# Patient Record
Sex: Male | Born: 2000 | Race: White | Hispanic: Yes | Marital: Single | Smoking: Never smoker
Health system: Southern US, Community
[De-identification: ages and names within clinical notes are randomized; demographics above are authoritative.]

## PROBLEM LIST (undated history)

## (undated) DIAGNOSIS — H539 Unspecified visual disturbance: Secondary | ICD-10-CM

## (undated) DIAGNOSIS — L858 Other specified epidermal thickening: Secondary | ICD-10-CM

## (undated) HISTORY — DX: Unspecified visual disturbance: H53.9

## (undated) HISTORY — DX: Other specified epidermal thickening: L85.8

---
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
## 2021-02-10 ENCOUNTER — Ambulatory Visit (INDEPENDENT_AMBULATORY_CARE_PROVIDER_SITE_OTHER): Payer: Medicaid Other

## 2021-02-10 DIAGNOSIS — Z23 Encounter for immunization: Secondary | ICD-10-CM | POA: Diagnosis not present

## 2021-02-10 NOTE — Progress Notes (Signed)
° °  Covid-19 Vaccination Clinic  Name:  Brian Randolph    MRN: [id number] DOB: 2000-03-27  02/10/2021  Mr. Brian Randolph was observed post Covid-19 immunization for 15 minutes without incident. He was provided with Vaccine Information Sheet and instruction to access the V-Safe system.   Mr. Brian Randolph was instructed to call 911 with any severe reactions post vaccine: Difficulty breathing  Swelling of face and throat  A fast heartbeat  A bad rash all over body  Dizziness and weakness   Immunizations Administered     Name Date Dose VIS Date Route   Pfizer Covid-19 Vaccine Bivalent Booster 02/10/2021  9:39 AM 0.3 mL 10/04/2020 Intramuscular   Manufacturer: [employer], [employer]   Lot: [id number]   NDC: [phone]

## 2021-03-19 ENCOUNTER — Ambulatory Visit (HOSPITAL_COMMUNITY)
Admission: EM | Admit: 2021-03-19 | Discharge: 2021-03-19 | Disposition: A | Discharge status: Home / Self Care | Payer: Medicaid Other | Attending: Internal Medicine | Admitting: Internal Medicine

## 2021-03-19 ENCOUNTER — Encounter (HOSPITAL_COMMUNITY): Payer: Self-pay

## 2021-03-19 DIAGNOSIS — R52 Pain, unspecified: Secondary | ICD-10-CM | POA: Diagnosis not present

## 2021-03-19 DIAGNOSIS — W57XXXA Bitten or stung by nonvenomous insect and other nonvenomous arthropods, initial encounter: Secondary | ICD-10-CM

## 2021-03-19 DIAGNOSIS — R509 Fever, unspecified: Secondary | ICD-10-CM

## 2021-03-19 MED ORDER — DOXYCYCLINE HYCLATE 100 MG PO CAPS: 100.0000 mg | ORAL_CAPSULE | Freq: Two times a day (BID) | ORAL | 0 refills | Status: DC

## 2021-03-19 MED ORDER — ACETAMINOPHEN 325 MG PO TABS
ORAL_TABLET | ORAL | Status: AC
  Filled 2021-03-19: qty 2

## 2021-03-19 MED ORDER — ACETAMINOPHEN 325 MG PO TABS
650.0000 mg | ORAL_TABLET | Freq: Once | ORAL | Status: AC
  Administered 2021-03-19: 650 mg via ORAL

## 2021-03-19 NOTE — Discharge Instructions (Signed)
The cause of your symptoms is truly unknown, typically with bedbug bite it should not cause a full-body infection have a low risk of causing a skin infection at all however since her symptoms have been present for 3 weeks we will attempt use of antibiotic to help  Take doxycycline twice a day for the next 7 days  Continue use of over-the-counter Tylenol or ibuprofen to manage fevers and body aches  Please follow-up with urgent care as needed if symptoms continue to persist

## 2021-03-19 NOTE — ED Triage Notes (Addendum)
Pt presents with intermittent generalized body aches X 3 weeks with no known injury.

## 2021-03-19 NOTE — ED Provider Notes (Signed)
[facility]    CSN: [id number] Arrival date & time: 03/19/21  1914      History   Chief Complaint Chief Complaint  Patient presents with   Generalized Body Aches    HPI Brian Randolph is a 21 y.o. male.   Patient presents with fever and body aches occurring intermittently for 3 weeks.  Symptoms began after bedbug bites to the left forearm.  Has attempted use of Tylenol which has been helpful in managing fever.  Denies nasal congestion, rhinorrhea, sore throat, ear pain, cough, shortness of breath, wheezing, abdominal pain, nausea, vomiting, diarrhea, headaches, urinary or bowel changes.    Past Medical History:  Diagnosis Date   Chronic cough due to GERD 05/08/2020   Keratosis pilaris    Vision abnormalities     Patient Active Problem List   Diagnosis Date Noted   Gastroesophageal reflux disease 07/04/2020   It band syndrome, right 06/03/2018   Sickle cell trait (HCC) 09/21/2014   Keratosis pilaris 08/25/2012   Myopia of both eyes 08/25/2012    History reviewed. No pertinent surgical history.     Home Medications    Prior to Admission medications   Medication Sig Start Date End Date Taking? Authorizing Provider  omeprazole (PRILOSEC OTC) 20 MG tablet Take 1 tablet (20 mg total) by mouth daily. Take 30 minutes before eating. Patient not taking: Reported on 07/04/2020 05/05/20   Ettefagh, Aron Baba, MD  omeprazole (PRILOSEC) 10 MG capsule Take 1 capsule (10 mg total) by mouth daily. 07/04/20   Ettefagh, Aron Baba, MD    Family History History reviewed. No pertinent family history.  Social History Social History   Tobacco Use   Smoking status: Never   Smokeless tobacco: Never     Allergies   Patient has no known allergies.   Review of Systems Review of Systems  Constitutional:  Positive for fever. Negative for activity change, appetite change, chills, diaphoresis, fatigue and unexpected weight change.  HENT: Negative.    Respiratory:  Negative.    Cardiovascular: Negative.   Gastrointestinal: Negative.   Musculoskeletal:  Positive for myalgias. Negative for arthralgias, back pain, gait problem, joint swelling, neck pain and neck stiffness.  Skin: Negative.   Neurological: Negative.     Physical Exam Triage Vital Signs ED Triage Vitals  Enc Vitals Group     BP 03/19/21 2030 118/76     Pulse Rate 03/19/21 2026 79     Resp 03/19/21 2026 18     Temp 03/19/21 2026 (!) 101.8 F (38.8 C)     Temp Source 03/19/21 2026 Oral     SpO2 03/19/21 2026 99 %     Weight --      Height --      Head Circumference --      Peak Flow --      Pain Score 03/19/21 2025 5     Pain Loc --      Pain Edu? --      Excl. in GC? --    No data found.  Updated Vital Signs BP 118/76    Pulse 79    Temp (!) 101.8 F (38.8 C) (Oral)    Resp 18    SpO2 99%   Visual Acuity Right Eye Distance:   Left Eye Distance:   Bilateral Distance:    Right Eye Near:   Left Eye Near:    Bilateral Near:     Physical Exam Constitutional:      Appearance:  Normal appearance.  HENT:     Head: Normocephalic.  Eyes:     Extraocular Movements: Extraocular movements intact.  Pulmonary:     Effort: Pulmonary effort is normal.  Skin:    Comments: Scant mildly erythematous macules present on the left forearm  Neurological:     Mental Status: He is alert and oriented to person, place, and time. Mental status is at baseline.  Psychiatric:        Mood and Affect: Mood normal.        Behavior: Behavior normal.     UC Treatments / Results  Labs (all labs ordered are listed, but only abnormal results are displayed) Labs Reviewed - No data to display  EKG   Radiology No results found.  Procedures Procedures (including critical care time)  Medications Ordered in UC Medications  acetaminophen (TYLENOL) tablet 650 mg (650 mg Oral Given 03/19/21 2030)    Initial Impression / Assessment and Plan / UC Course  I have reviewed the triage vital  signs and the nursing notes.  Pertinent labs & imaging results that were available during my care of the patient were reviewed by me and considered in my medical decision making (see chart for details).  Fever Body aches Bug bite, initial encounter  Etiology of symptoms unknown at this time, patient endorses bug bite but skin is inconclusive due to timeline of illness, also bedbug bites should not initiate systemic reaction, discussed with patient however due to timeline of illness will move forward with coverage for bacteria, doxycycline 7-day course prescribed, Tylenol given in office for fever of 101.8 in triage, patient encouraged to continue use of Tylenol in the outpatient setting, may follow-up with urgent care as needed Final Clinical Impressions(s) / UC Diagnoses   Final diagnoses:  None   Discharge Instructions   None    ED Prescriptions   None    PDMP not reviewed this encounter.   Valinda Hoar, NP 03/20/21 1002

## 2021-04-13 ENCOUNTER — Encounter (HOSPITAL_COMMUNITY): Payer: Self-pay

## 2021-04-13 ENCOUNTER — Ambulatory Visit (HOSPITAL_COMMUNITY)
Admission: EM | Admit: 2021-04-13 | Discharge: 2021-04-13 | Disposition: A | Discharge status: Home / Self Care | Payer: Medicaid Other | Attending: Nurse Practitioner | Admitting: Nurse Practitioner

## 2021-04-13 ENCOUNTER — Other Ambulatory Visit: Payer: Self-pay

## 2021-04-13 DIAGNOSIS — J111 Influenza due to unidentified influenza virus with other respiratory manifestations: Secondary | ICD-10-CM

## 2021-04-13 DIAGNOSIS — J301 Allergic rhinitis due to pollen: Secondary | ICD-10-CM | POA: Insufficient documentation

## 2021-04-13 DIAGNOSIS — R509 Fever, unspecified: Secondary | ICD-10-CM | POA: Insufficient documentation

## 2021-04-13 DIAGNOSIS — R52 Pain, unspecified: Secondary | ICD-10-CM | POA: Insufficient documentation

## 2021-04-13 DIAGNOSIS — R067 Sneezing: Secondary | ICD-10-CM | POA: Insufficient documentation

## 2021-04-13 DIAGNOSIS — Z20822 Contact with and (suspected) exposure to covid-19: Secondary | ICD-10-CM | POA: Insufficient documentation

## 2021-04-13 LAB — CBC WITH DIFFERENTIAL/PLATELET
Abs Immature Granulocytes: 0.04 10*3/uL (ref 0.00–0.07)
Basophils Absolute: 0 10*3/uL (ref 0.0–0.1)
Basophils Relative: 1 %
Eosinophils Absolute: 0.1 10*3/uL (ref 0.0–0.5)
Eosinophils Relative: 2 %
HCT: 36.4 % — ABNORMAL LOW (ref 39.0–52.0)
Hemoglobin: 13.1 g/dL (ref 13.0–17.0)
Immature Granulocytes: 1 %
Lymphocytes Relative: 28 %
Lymphs Abs: 2.3 10*3/uL (ref 0.7–4.0)
MCH: 30.5 pg (ref 26.0–34.0)
MCHC: 36 g/dL (ref 30.0–36.0)
MCV: 84.8 fL (ref 80.0–100.0)
Monocytes Absolute: 1.1 10*3/uL — ABNORMAL HIGH (ref 0.1–1.0)
Monocytes Relative: 14 %
Neutro Abs: 4.5 10*3/uL (ref 1.7–7.7)
Neutrophils Relative %: 54 %
Platelets: 155 10*3/uL (ref 150–400)
RBC: 4.29 MIL/uL (ref 4.22–5.81)
RDW: 12.8 % (ref 11.5–15.5)
WBC: 8.1 10*3/uL (ref 4.0–10.5)
nRBC: 0 % (ref 0.0–0.2)

## 2021-04-13 LAB — POC INFLUENZA A AND B ANTIGEN (URGENT CARE ONLY)
INFLUENZA A ANTIGEN, POC: NEGATIVE
INFLUENZA B ANTIGEN, POC: NEGATIVE

## 2021-04-13 LAB — SARS CORONAVIRUS 2 (TAT 6-24 HRS): SARS Coronavirus 2: NEGATIVE

## 2021-04-13 NOTE — ED Provider Notes (Signed)
?[facility] ? ? ? ?CSN: [id number] ?Arrival date & time: 04/13/21  1048 ? ? ?  ? ?History   ?Chief Complaint ?Chief Complaint  ?Patient presents with  ? Fever  ? Generalized Body Aches  ? ? ?HPI ?Brian Randolph is a 21 y.o. male.  ? ?Patient reports 4-day history of tactile fever, sweats, body aches.  He denies cough, shortness of breath, wheezing, chest tightness, congestion, sore throat, ear pain, nausea, vomiting, diarrhea, and change in appetite.  He denies any new rash and any sick contacts.  He reports sneezing, eye redness/itching, abdominal pain, headache when he has a fever, and increased fatigue.  He has taken Tylenol with relief of the fever. ? ?He works outside as a Administrator. ? ? ? ? ?Past Medical History:  ?Diagnosis Date  ? Chronic cough due to GERD 05/08/2020  ? Keratosis pilaris   ? Vision abnormalities   ? ? ?Patient Active Problem List  ? Diagnosis Date Noted  ? Gastroesophageal reflux disease 07/04/2020  ? It band syndrome, right 06/03/2018  ? Sickle cell trait (HCC) 09/21/2014  ? Keratosis pilaris 08/25/2012  ? Myopia of both eyes 08/25/2012  ? ? ?History reviewed. No pertinent surgical history. ? ? ? ? ?Home Medications   ? ?Prior to Admission medications   ?Not on File  ? ? ?Family History ?History reviewed. No pertinent family history. ? ?Social History ?Social History  ? ?Tobacco Use  ? Smoking status: Never  ? Smokeless tobacco: Never  ?Substance Use Topics  ? Alcohol use: Never  ? Drug use: Never  ? ? ? ?Allergies   ?Patient has no known allergies. ? ? ?Review of Systems ?Review of Systems ?Per HPI ? ?Physical Exam ?Triage Vital Signs ?ED Triage Vitals  ?Enc Vitals Group  ?   BP 04/13/21 1121 127/70  ?   Pulse Rate 04/13/21 1121 78  ?   Resp 04/13/21 1121 18  ?   Temp 04/13/21 1121 98.7 ?F (37.1 ?C)  ?   Temp Source 04/13/21 1121 Oral  ?   SpO2 04/13/21 1121 98 %  ?   Weight --   ?   Height --   ?   Head Circumference --   ?   Peak Flow --   ?   Pain Score 04/13/21 1122 0  ?    Pain Loc --   ?   Pain Edu? --   ?   Excl. in GC? --   ? ?No data found. ? ?Updated Vital Signs ?BP 127/70 (BP Location: Left Arm)   Pulse 78   Temp 98.7 ?F (37.1 ?C) (Oral)   Resp 18   SpO2 98%  ? ?Visual Acuity ?Right Eye Distance:   ?Left Eye Distance:   ?Bilateral Distance:   ? ?Right Eye Near:   ?Left Eye Near:    ?Bilateral Near:    ? ?Physical Exam ?Vitals and nursing note reviewed.  ?Constitutional:   ?   General: He is not in acute distress. ?   Appearance: Normal appearance. He is not toxic-appearing.  ?HENT:  ?   Head: Normocephalic and atraumatic.  ?   Right Ear: Tympanic membrane, ear canal and external ear normal. There is no impacted cerumen.  ?   Left Ear: Tympanic membrane, ear canal and external ear normal. There is no impacted cerumen.  ?   Nose: Nose normal. No congestion or rhinorrhea.  ?   Mouth/Throat:  ?   Mouth: Mucous membranes are  moist.  ?   Pharynx: Oropharynx is clear. No oropharyngeal exudate or posterior oropharyngeal erythema.  ?Eyes:  ?   General: No scleral icterus. ?   Extraocular Movements: Extraocular movements intact.  ?Cardiovascular:  ?   Rate and Rhythm: Normal rate and regular rhythm.  ?Pulmonary:  ?   Effort: Pulmonary effort is normal. No respiratory distress.  ?   Breath sounds: Normal breath sounds. No wheezing, rhonchi or rales.  ?Abdominal:  ?   General: Abdomen is flat. Bowel sounds are normal.  ?   Palpations: Abdomen is soft.  ?Musculoskeletal:  ?   Cervical back: Normal range of motion.  ?   Right lower leg: No edema.  ?   Left lower leg: No edema.  ?Lymphadenopathy:  ?   Cervical: No cervical adenopathy.  ?Skin: ?   General: Skin is warm and dry.  ?   Capillary Refill: Capillary refill takes less than 2 seconds.  ?   Coloration: Skin is not jaundiced or pale.  ?   Findings: No erythema or rash.  ?Neurological:  ?   Mental Status: He is alert and oriented to person, place, and time.  ? ? ? ?UC Treatments / Results  ?Labs ?(all labs ordered are listed, but  only abnormal results are displayed) ?Labs Reviewed  ?SARS CORONAVIRUS 2 (TAT 6-24 HRS)  ?CBC WITH DIFFERENTIAL/PLATELET  ?POC INFLUENZA A AND B ANTIGEN (URGENT CARE ONLY)  ? ? ?EKG ? ? ?Radiology ?No results found. ? ?Procedures ?Procedures (including critical care time) ? ?Medications Ordered in UC ?Medications - No data to display ? ?Initial Impression / Assessment and Plan / UC Course  ?I have reviewed the triage vital signs and the nursing notes. ? ?Pertinent labs & imaging results that were available during my care of the patient were reviewed by me and considered in my medical decision making (see chart for details). ? ?  ?Symptoms consistent with acute viral illness.  Will check COVID and CBC given recent febrile illness ~1 month ago.  Want to ensure no underlying anemia or elevated WBC count.  Influenza testing today is negative. He also has some symptoms consistent with allergies that sound more chronic in nature.  Can try cetirizine and flonase for allergy symptoms on a long-term basis.  Remain out of work until COVID result is back.  Note given for work.  ?Final Clinical Impressions(s) / UC Diagnoses  ? ?Final diagnoses:  ?Influenza-like illness  ?Body aches  ?Seasonal allergic rhinitis due to pollen  ? ? ? ?Discharge Instructions   ? ?  ?You can use cetirizine and flonase for the allergy symptoms.  Influenza testing today is negative.  We will let you know if any testing comes back positive or abnormal.  Please stay out of work today and you may return tomorrow if you do not hear from [place].  Drink plenty of water and alternate Tylenol and ibuprofen for fevers.   ? ? ? ? ?ED Prescriptions   ?None ?  ? ?PDMP not reviewed this encounter. ?  ?Valentino Nose, NP ?04/13/21 1238 ? ?

## 2021-04-13 NOTE — Discharge Instructions (Addendum)
You can use cetirizine and flonase for the allergy symptoms.  Influenza testing today is negative.  We will let you know if any testing comes back positive or abnormal.  Please stay out of work today and you may return tomorrow if you do not hear from [place].  Drink plenty of water and alternate Tylenol and ibuprofen for fevers.   ?

## 2021-04-13 NOTE — ED Triage Notes (Signed)
Pt c/o fever with body aches since Monday. States had tylenol this am.  ?

## 2021-06-26 ENCOUNTER — Emergency Department (HOSPITAL_COMMUNITY): Payer: Worker's Compensation

## 2021-06-26 ENCOUNTER — Other Ambulatory Visit: Payer: Self-pay

## 2021-06-26 ENCOUNTER — Emergency Department (HOSPITAL_COMMUNITY)
Admission: EM | Admit: 2021-06-26 | Discharge: 2021-06-26 | Disposition: A | Discharge status: Home / Self Care | Payer: Worker's Compensation | Attending: Emergency Medicine | Admitting: Emergency Medicine

## 2021-06-26 DIAGNOSIS — W268XXA Contact with other sharp object(s), not elsewhere classified, initial encounter: Secondary | ICD-10-CM | POA: Diagnosis not present

## 2021-06-26 DIAGNOSIS — S61011A Laceration without foreign body of right thumb without damage to nail, initial encounter: Secondary | ICD-10-CM | POA: Diagnosis not present

## 2021-06-26 DIAGNOSIS — S6991XA Unspecified injury of right wrist, hand and finger(s), initial encounter: Secondary | ICD-10-CM | POA: Diagnosis present

## 2021-06-26 DIAGNOSIS — Y990 Civilian activity done for income or pay: Secondary | ICD-10-CM | POA: Diagnosis not present

## 2021-06-26 DIAGNOSIS — Z23 Encounter for immunization: Secondary | ICD-10-CM | POA: Diagnosis not present

## 2021-06-26 MED ORDER — TETANUS-DIPHTH-ACELL PERTUSSIS 5-2.5-18.5 LF-MCG/0.5 IM SUSY
0.5000 mL | PREFILLED_SYRINGE | Freq: Once | INTRAMUSCULAR | Status: AC
  Administered 2021-06-26: 0.5 mL via INTRAMUSCULAR
  Filled 2021-06-26: qty 0.5

## 2021-06-26 MED ORDER — LIDOCAINE HCL (PF) 1 % IJ SOLN
30.0000 mL | Freq: Once | INTRAMUSCULAR | Status: AC
  Administered 2021-06-26: 30 mL
  Filled 2021-06-26: qty 30

## 2021-06-26 NOTE — ED Provider Notes (Signed)
[facility] EMERGENCY DEPARTMENT Provider Note   CSN: [id number] Arrival date & time: 06/26/21  1334     History {Add pertinent medical, surgical, social history, OB history to HPI:1} Chief Complaint  Patient presents with  . Finger Injury    R thumb    Brian Randolph is a 21 y.o. male.  HPI     Home Medications Prior to Admission medications   Not on File      Allergies    Patient has no known allergies.    Review of Systems   Review of Systems  Physical Exam Updated Vital Signs BP 121/70 (BP Location: Left Arm)   Pulse (!) 50   Temp 98.4 F (36.9 C) (Oral)   Resp 16   SpO2 100%  Physical Exam  ED Results / Procedures / Treatments   Labs (all labs ordered are listed, but only abnormal results are displayed) Labs Reviewed - No data to display  EKG None  Radiology DG Hand Complete Right  Result Date: 06/26/2021 CLINICAL DATA:  Thumb laceration. EXAM: RIGHT HAND - COMPLETE 3+ VIEW COMPARISON:  None Available. FINDINGS: There is no evidence of fracture or dislocation. There is no evidence of arthropathy or other focal bone abnormality. No radiopaque foreign body. IMPRESSION: 1. No evidence of fracture or dislocation. 2. No radiopaque foreign body. Electronically Signed   By: Larose Hires D.O.   On: 06/26/2021 15:06    Procedures .Marland KitchenLaceration Repair  Date/Time: 06/26/2021 10:46 PM Performed by: Ernie Avena, MD Authorized by: Ernie Avena, MD   Consent:    Consent obtained:  Verbal   Consent given by:  Patient Universal protocol:    Patient identity confirmed:  Verbally with patient Anesthesia:    Anesthesia method:  Local infiltration   Local anesthetic:  Lidocaine 1% w/o epi Laceration details:    Location:  Finger   Finger location:  R thumb   Length (cm):  1 Pre-procedure details:    Preparation:  Imaging obtained to evaluate for foreign bodies Exploration:    Imaging outcome: foreign body not noted     Wound  exploration: entire depth of wound visualized   Treatment:    Area cleansed with:  Saline   Amount of cleaning:  Standard Skin repair:    Repair method:  Sutures   Suture size:  5-0   Suture material:  Prolene   Number of sutures:  3 Approximation:    Approximation:  Close Repair type:    Repair type:  Simple Post-procedure details:    Dressing:  Open (no dressing)   Procedure completion:  Tolerated   {Document cardiac monitor, telemetry assessment procedure when appropriate:1}  Medications Ordered in ED Medications  lidocaine (PF) (XYLOCAINE) 1 % injection 30 mL (has no administration in time range)  Tdap (BOOSTRIX) injection 0.5 mL (has no administration in time range)    ED Course/ Medical Decision Making/ A&P                           Medical Decision Making  ***  {Document critical care time when appropriate:1} {Document review of labs and clinical decision tools ie heart score, Chads2Vasc2 etc:1}  {Document your independent review of radiology images, and any outside records:1} {Document your discussion with family members, caretakers, and with consultants:1} {Document social determinants of health affecting pt's care:1} {Document your decision making why or why not admission, treatments were needed:1} Final Clinical Impression(s) / ED Diagnoses  Final diagnoses:  None    Rx / DC Orders ED Discharge Orders     None

## 2021-06-26 NOTE — Discharge Instructions (Addendum)
Your tetanus was updated.  Your laceration of your thumb was repaired with 3 sutures.  Recommend they be removed in 7 days.  Watch for signs of developing infection which would include redness, swelling, worsening pain, purulent drainage.

## 2021-06-26 NOTE — ED Provider Triage Note (Signed)
Emergency Medicine Provider Triage Evaluation Note  Brian Randolph , a 21 y.o. male  was evaluated in triage.  Pt complains of right first digit laceration.  He was working on his mower when he lacerated near the IP joint of the right first digit.  Unsure of last tetanus feels that it was greater than 5 years ago.  Denies any numbness.  Review of Systems  Positive: Laceration Negative: Numbness  Physical Exam  BP 122/66 (BP Location: Left Arm)   Pulse (!) 55   Temp 97.6 F (36.4 C) (Oral)   Resp 17   SpO2 99%  Gen:   Awake, no distress   Resp:  Normal effort  MSK:   Moves extremities without difficulty  Other:  1.5cm laceration noted on the IP joint of the right first digit without changes to range of motion  Medical Decision Making  Medically screening exam initiated at 2:39 PM.  Appropriate orders placed.  Saahir Gidcumb was informed that the remainder of the evaluation will be completed by another provider, this initial triage assessment does not replace that evaluation, and the importance of remaining in the ED until their evaluation is complete.  X-ray ordered will need cleaning and possibly wound closure   Delia Heady, PA-C 06/26/21 1440

## 2021-06-26 NOTE — ED Notes (Signed)
Bandage applied to stitched area. Pt tolerated well. Discharge instructions reviewed and pt verbalized understanding. Pt AOX4. Respirations even and unlabored with no distress noted. Ambulatory to ED lobby with strong and steady gait.

## 2021-06-26 NOTE — ED Triage Notes (Signed)
Pt reports working on his mower when he cut his right thumb on the deck of the mower. Bleeding controlled.

## 2021-06-27 ENCOUNTER — Telehealth: Payer: Self-pay

## 2021-06-27 NOTE — Telephone Encounter (Signed)
Transition Care Management Unsuccessful Follow-up Telephone Call  Date of discharge and from where:  06/26/2021-[place]   Attempts:  1st Attempt  Reason for unsuccessful TCM follow-up call:  Unable to leave message    

## 2021-06-28 NOTE — Telephone Encounter (Signed)
Transition Care Management Unsuccessful Follow-up Telephone Call  Date of discharge and from where:  06/26/2021-[place]   Attempts:  2nd Attempt  Reason for unsuccessful TCM follow-up call:  Unable to leave message    

## 2021-06-29 NOTE — Telephone Encounter (Signed)
Transition Care Management Unsuccessful Follow-up Telephone Call  Date of discharge and from where:  06/26/2021-[place]   Attempts:  3rd Attempt  Reason for unsuccessful TCM follow-up call:  Unable to reach patient    

## 2021-07-03 ENCOUNTER — Other Ambulatory Visit: Payer: Self-pay

## 2021-07-03 ENCOUNTER — Ambulatory Visit (INDEPENDENT_AMBULATORY_CARE_PROVIDER_SITE_OTHER): Payer: Medicaid Other | Admitting: Pediatrics

## 2021-07-03 VITALS — HR 53 | Temp 98.0°F | Wt 151.6 lb

## 2021-07-03 DIAGNOSIS — Z4802 Encounter for removal of sutures: Secondary | ICD-10-CM

## 2021-07-03 NOTE — Progress Notes (Signed)
   Subjective:     Brian Randolph, is a 21 y.o. male   History provider by patient No interpreter necessary.  Chief Complaint  Patient presents with   Suture / Staple Removal    Rt thumb    HPI:  -Sustained a simple laceration to his right thumb on 5/23 and underwent repair with 3 simple sutures - Since that time has been doing well - Received Tdap booster at ED - Here for suture removal  Patient's history was reviewed and updated as appropriate: allergies, current medications, past family history, past medical history, past social history, past surgical history, and problem list.     Objective:     Pulse (!) 53   Temp 98 F (36.7 C) (Oral)   Wt 151 lb 9.6 oz (68.8 kg)   SpO2 98%   BMI 22.45 kg/m   Physical Exam Vitals reviewed.  Constitutional:      General: He is not in acute distress.    Appearance: Normal appearance. He is not toxic-appearing.     Comments: Well appearing; NAD  HENT:     Head: Normocephalic and atraumatic.     Right Ear: External ear normal.     Left Ear: External ear normal.     Nose: Nose normal.  Eyes:     Extraocular Movements: Extraocular movements intact.     Conjunctiva/sclera: Conjunctivae normal.  Cardiovascular:     Comments: Warm and well perfused Pulmonary:     Effort: Pulmonary effort is normal.     Comments: Nml WOB in RA Musculoskeletal:        General: Normal range of motion.     Cervical back: Normal range of motion.  Skin:    General: Skin is warm and dry.     Capillary Refill: Capillary refill takes less than 2 seconds.     Comments: Laceration on the volar aspect of the R thumb; well healing; no erythema or drainage; 3 sutures visualized   Neurological:     General: No focal deficit present.       Assessment & Plan:   Suture Removal: 3 complete sutures removed without difficulty; wound cleansed with alcohol swab and bandage placed.  Patient tolerated without difficulty. - Recommended using Neosporin  twice daily for the next 7 days - Encouraged hand hygiene - Reviewed red flag signs including warmth, redness, swelling, or tenderness - Also discussed with patient that we would recommend establishing care with an adult doctor and provided resources  Supportive care and return precautions reviewed.  Return if symptoms worsen or fail to improve.  Darcus Pester, MD

## 2021-07-03 NOTE — Patient Instructions (Signed)
-  Please use neosporin on your healing cut twice daily until your cut closes

## 2021-08-30 ENCOUNTER — Telehealth: Payer: Self-pay | Admitting: Pediatrics

## 2021-08-30 NOTE — Telephone Encounter (Signed)

## 2021-09-28 ENCOUNTER — Encounter: Payer: Self-pay | Admitting: Pediatrics

## 2022-01-02 DIAGNOSIS — R6883 Chills (without fever): Secondary | ICD-10-CM | POA: Diagnosis not present

## 2022-01-02 DIAGNOSIS — R059 Cough, unspecified: Secondary | ICD-10-CM | POA: Diagnosis not present

## 2022-01-02 DIAGNOSIS — B349 Viral infection, unspecified: Secondary | ICD-10-CM | POA: Diagnosis not present

## 2022-04-03 DIAGNOSIS — J069 Acute upper respiratory infection, unspecified: Secondary | ICD-10-CM | POA: Diagnosis not present

## 2022-04-03 DIAGNOSIS — R059 Cough, unspecified: Secondary | ICD-10-CM | POA: Diagnosis not present

## 2022-04-03 DIAGNOSIS — R0981 Nasal congestion: Secondary | ICD-10-CM | POA: Diagnosis not present

## 2022-10-12 IMAGING — DX DG HAND COMPLETE 3+V*R*
3 series · 3 of 3 positions shown · non-contrast
Comparison: None Available.

CLINICAL DATA: Thumb laceration.

EXAM:
RIGHT HAND - COMPLETE 3+ VIEW

[x hand pa right]
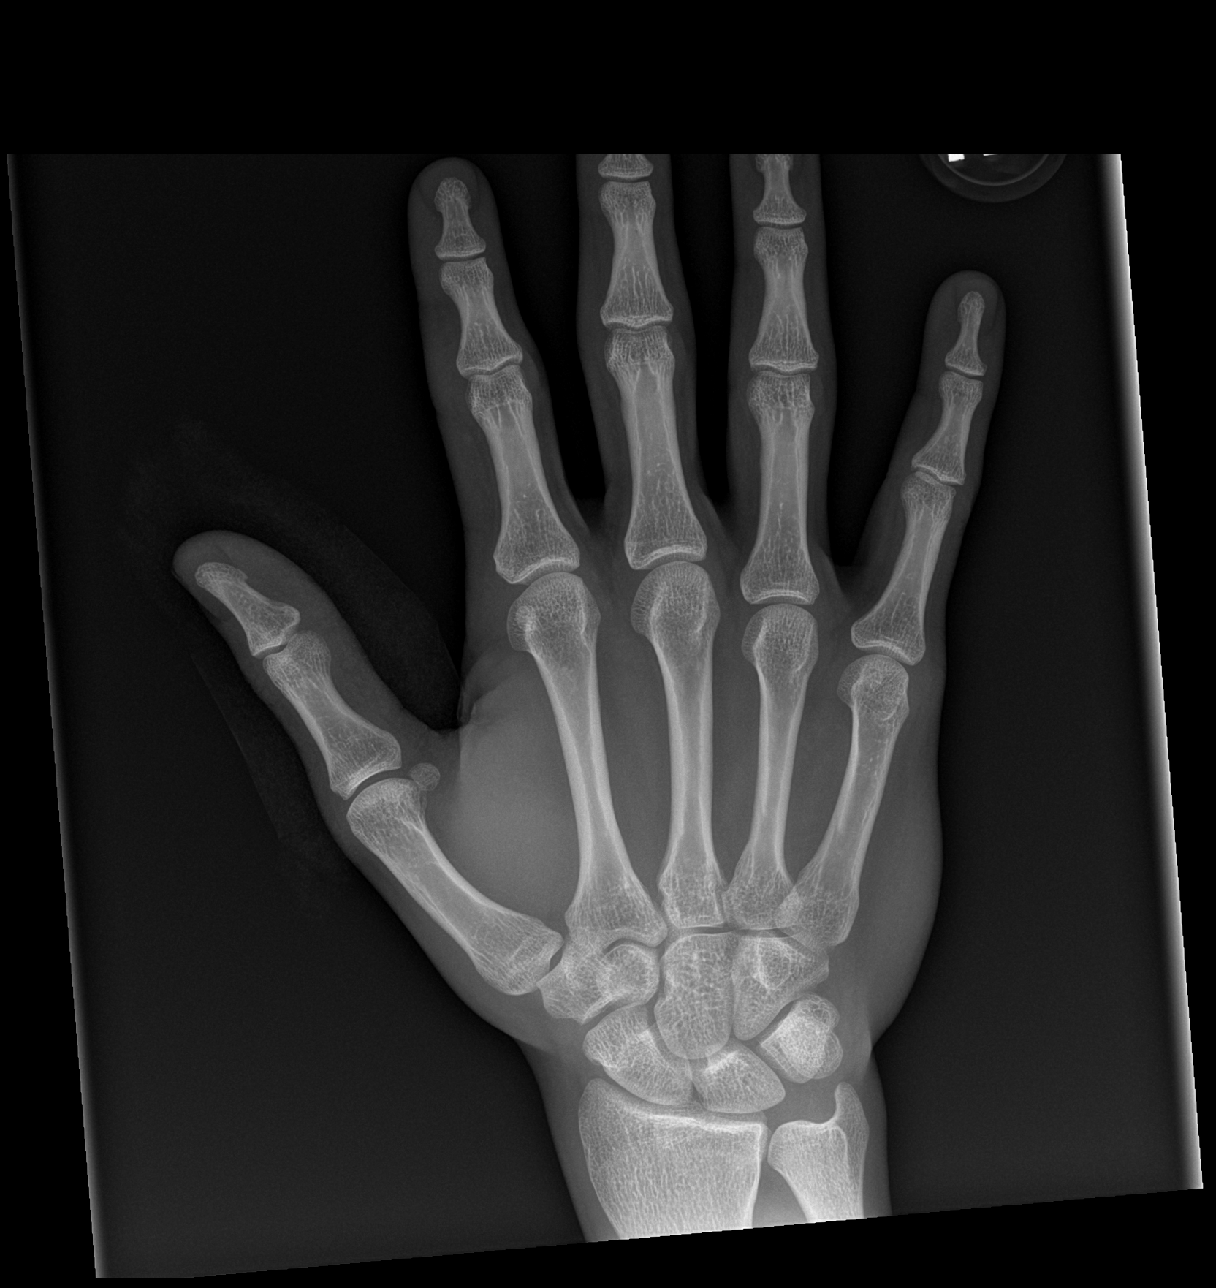

[x hand obl right]
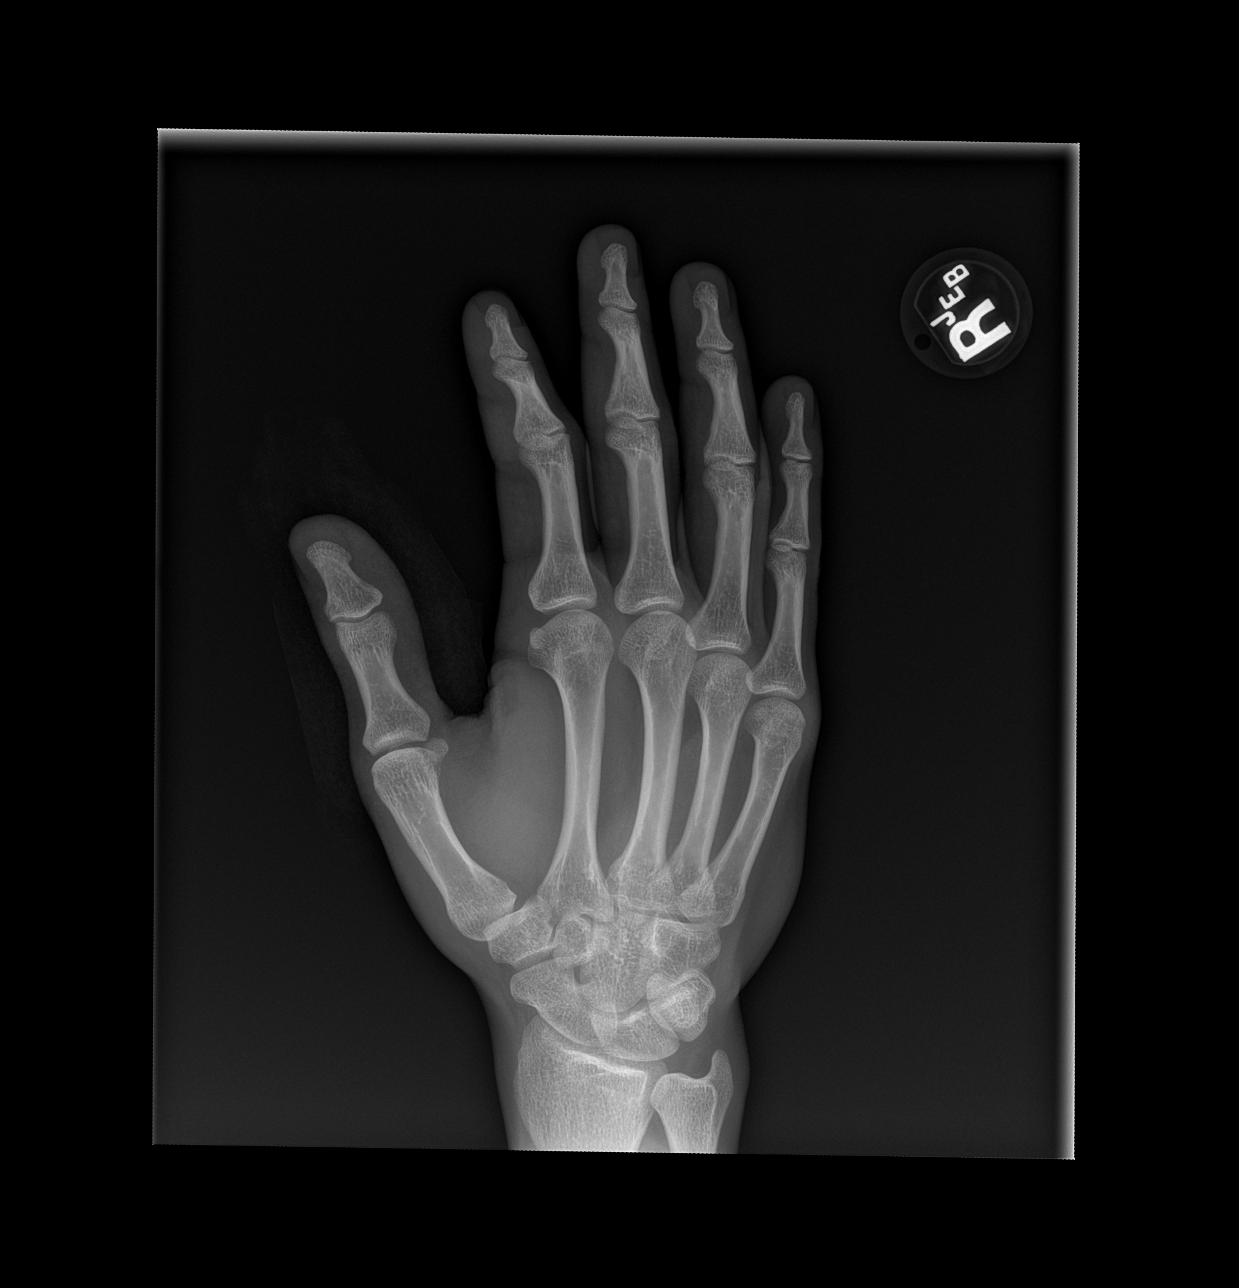

[x hand lat right]
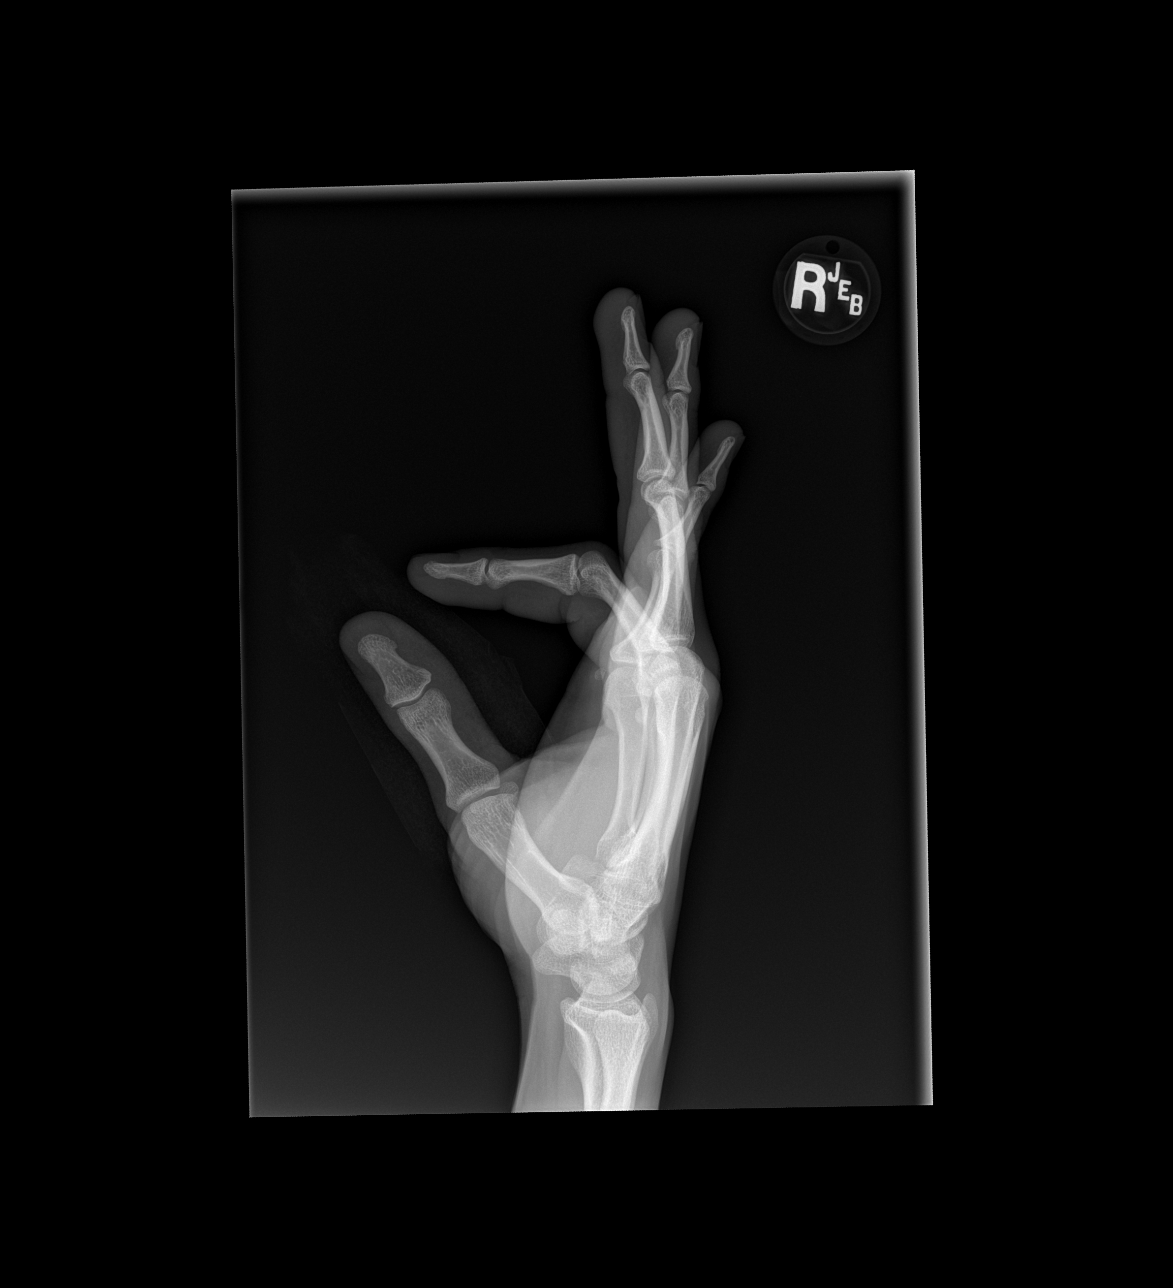

[3 of 3 positions shown; findings below may reference images not displayed]

FINDINGS: There is no evidence of fracture or dislocation. There is no
evidence of arthropathy or other focal bone abnormality. No
radiopaque foreign body.
IMPRESSION: 1. No evidence of fracture or dislocation.
2. No radiopaque foreign body.

## 2022-10-29 DIAGNOSIS — Z23 Encounter for immunization: Secondary | ICD-10-CM | POA: Diagnosis not present

## 2022-10-29 DIAGNOSIS — M79672 Pain in left foot: Secondary | ICD-10-CM | POA: Diagnosis not present

## 2022-10-29 DIAGNOSIS — M722 Plantar fascial fibromatosis: Secondary | ICD-10-CM | POA: Diagnosis not present

## 2023-01-13 DIAGNOSIS — S63641A Sprain of metacarpophalangeal joint of right thumb, initial encounter: Secondary | ICD-10-CM | POA: Diagnosis not present

## 2023-01-13 DIAGNOSIS — H5213 Myopia, bilateral: Secondary | ICD-10-CM | POA: Diagnosis not present

## 2023-01-13 DIAGNOSIS — W230XXA Caught, crushed, jammed, or pinched between moving objects, initial encounter: Secondary | ICD-10-CM | POA: Diagnosis not present
